# Patient Record
Sex: Male | Born: 2000 | Race: White | Hispanic: No | Marital: Single | Smoking: Never smoker
Health system: Southern US, Community
[De-identification: ages and names within clinical notes are randomized; demographics above are authoritative.]

---
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
## 2019-05-02 IMAGING — DX DG ABDOMEN 1V
2 series · 2 of 2 positions shown · non-contrast
Comparison: None.

CLINICAL DATA: Generalized abdominal pain with constipation and
diarrhea.

EXAM:
ABDOMEN - 1 VIEW

[dg abd 1 view (1 of 2)]
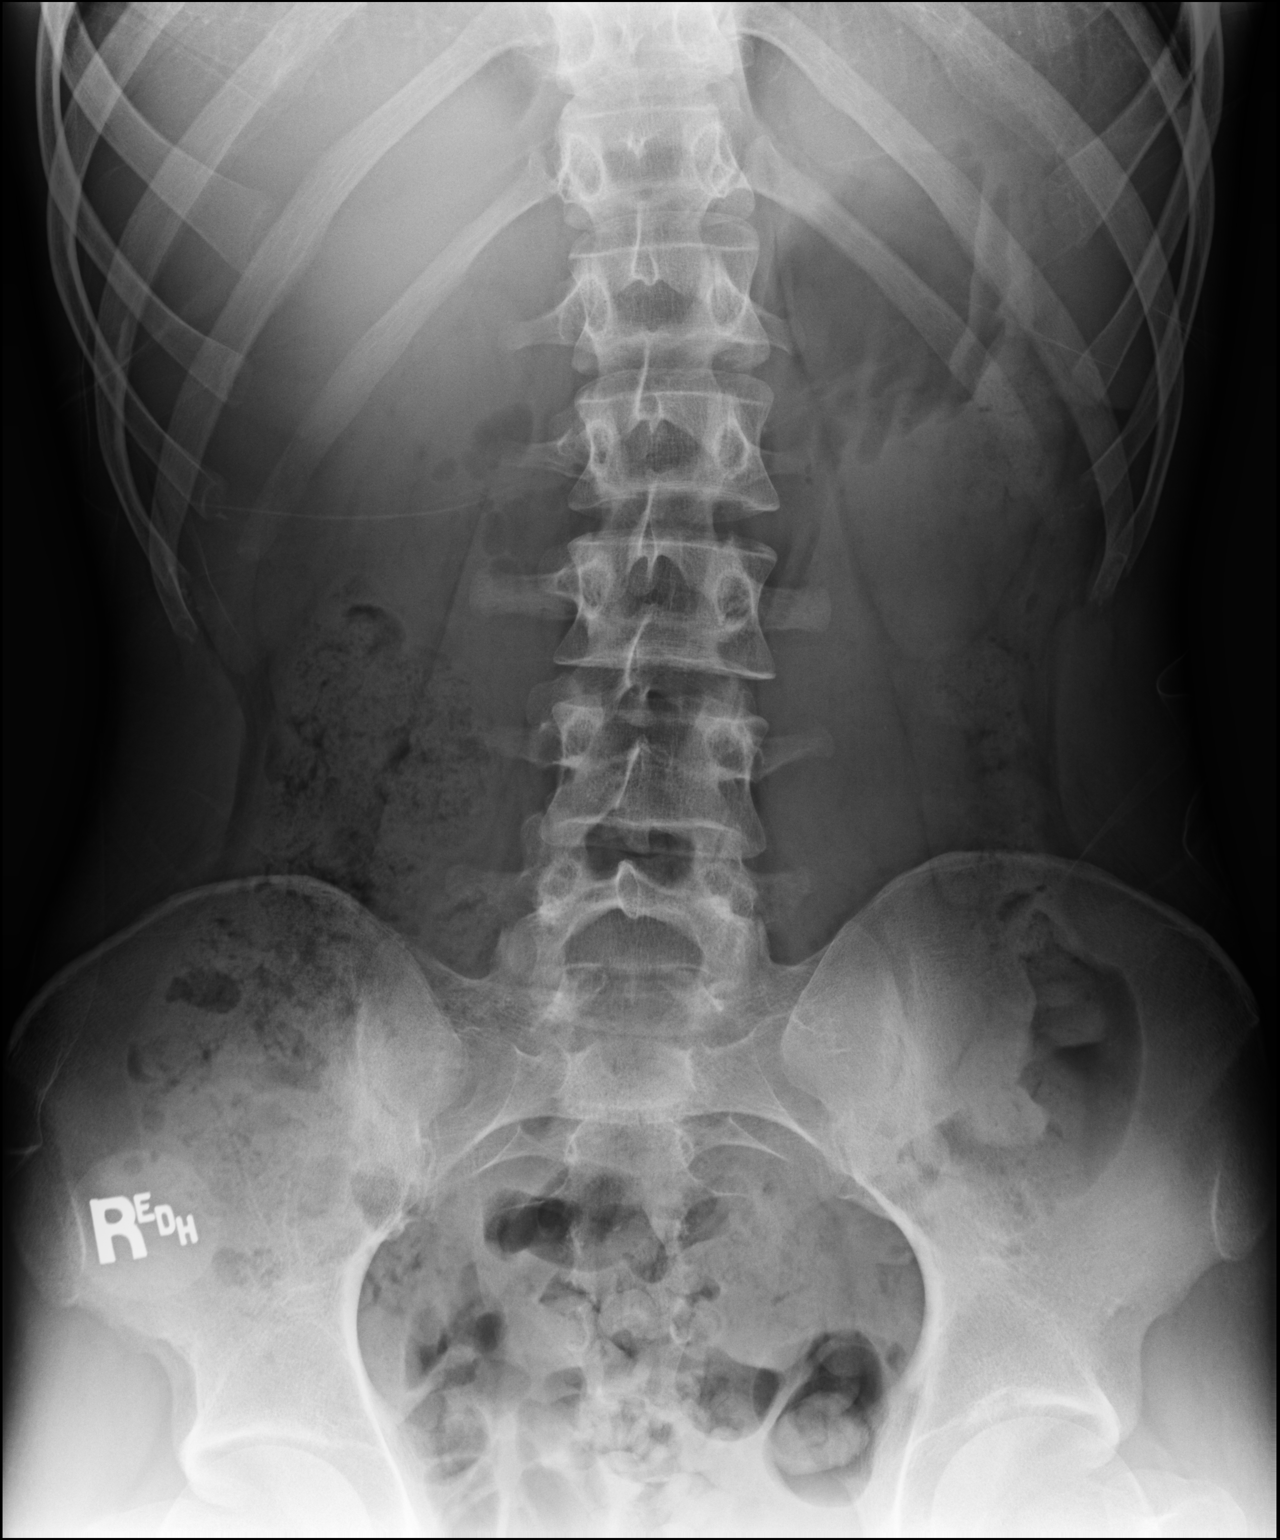

[dg abd 1 view (2 of 2)]
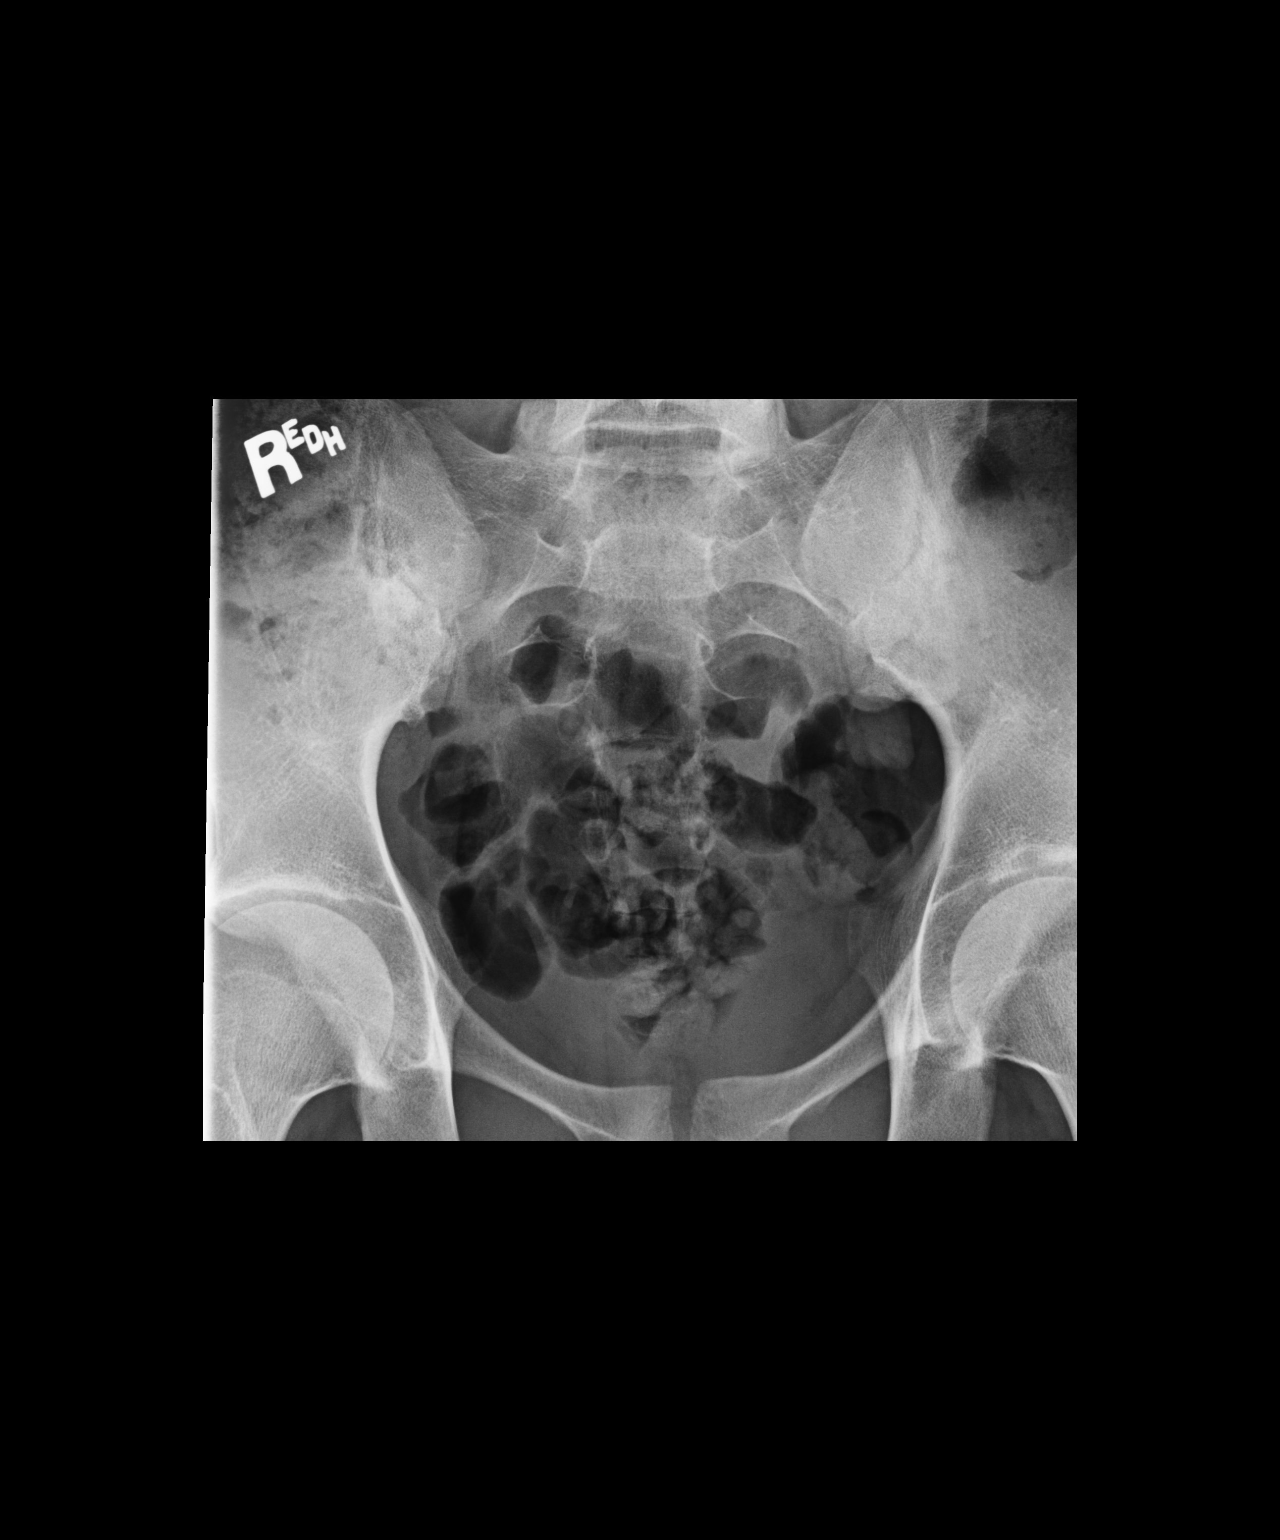

[2 of 2 positions shown; findings below may reference images not displayed]

FINDINGS: No evidence of ileus or obstruction. Moderate to large amount of
fecal matter in the colon. No abnormal calcifications. Mild spinal
curvature. Apparent arthritis of the sacroiliac joints, unusual at
this age. Possibly seronegative spondylitis related to inflammatory
bowel disease.
IMPRESSION: Moderate to large amount of fecal matter within the colon.
Nonobstructed pattern.

Bilateral sacroiliac arthropathy, possibly seronegative spondylitis
secondary to inflammatory bowel disease.
